# Patient Record
Sex: Male | Born: 1980 | Race: Black or African American | Hispanic: No | Marital: Single
Health system: Southern US, Community
[De-identification: ages and names within clinical notes are randomized; demographics above are authoritative.]

---

## 2004-04-19 ENCOUNTER — Inpatient Hospital Stay (HOSPITAL_COMMUNITY): Admission: EM | Admit: 2004-04-19 | Discharge: 2004-04-20 | Payer: Self-pay

## 2004-04-25 ENCOUNTER — Emergency Department (HOSPITAL_COMMUNITY): Admission: EM | Admit: 2004-04-25 | Discharge: 2004-04-25 | Payer: Self-pay | Admitting: Emergency Medicine

## 2005-06-03 ENCOUNTER — Emergency Department (HOSPITAL_COMMUNITY): Admission: EM | Admit: 2005-06-03 | Discharge: 2005-06-03 | Payer: Self-pay | Admitting: Emergency Medicine

## 2010-08-27 ENCOUNTER — Emergency Department (HOSPITAL_COMMUNITY): Admission: EM | Admit: 2010-08-27 | Discharge: 2010-08-27 | Payer: Self-pay | Admitting: Emergency Medicine
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
## 2011-07-05 ENCOUNTER — Ambulatory Visit
Admission: RE | Admit: 2011-07-05 | Discharge: 2011-07-05 | Disposition: A | Discharge status: Home / Self Care | Payer: Self-pay | Source: Ambulatory Visit | Attending: Family Medicine | Admitting: Family Medicine

## 2011-07-05 ENCOUNTER — Other Ambulatory Visit: Payer: Self-pay | Admitting: Family Medicine

## 2011-07-05 DIAGNOSIS — R609 Edema, unspecified: Secondary | ICD-10-CM

## 2011-07-05 DIAGNOSIS — T1490XA Injury, unspecified, initial encounter: Secondary | ICD-10-CM
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
## 2020-10-25 ENCOUNTER — Encounter (HOSPITAL_COMMUNITY): Payer: Self-pay | Admitting: Emergency Medicine

## 2020-10-25 ENCOUNTER — Emergency Department (HOSPITAL_COMMUNITY)
Admission: EM | Admit: 2020-10-25 | Discharge: 2020-10-25 | Disposition: A | Discharge status: Home / Self Care | Payer: PRIVATE HEALTH INSURANCE | Attending: Emergency Medicine | Admitting: Emergency Medicine

## 2020-10-25 ENCOUNTER — Emergency Department (HOSPITAL_COMMUNITY): Payer: PRIVATE HEALTH INSURANCE

## 2020-10-25 DIAGNOSIS — M79602 Pain in left arm: Secondary | ICD-10-CM | POA: Insufficient documentation

## 2020-10-25 DIAGNOSIS — R0789 Other chest pain: Secondary | ICD-10-CM | POA: Insufficient documentation

## 2020-10-25 DIAGNOSIS — R079 Chest pain, unspecified: Secondary | ICD-10-CM

## 2020-10-25 LAB — CBC
HCT: 44.9 % (ref 39.0–52.0)
Hemoglobin: 14.9 g/dL (ref 13.0–17.0)
MCH: 32.3 pg (ref 26.0–34.0)
MCHC: 33.2 g/dL (ref 30.0–36.0)
MCV: 97.4 fL (ref 80.0–100.0)
Platelets: 288 10*3/uL (ref 150–400)
RBC: 4.61 MIL/uL (ref 4.22–5.81)
RDW: 12.4 % (ref 11.5–15.5)
WBC: 6.2 10*3/uL (ref 4.0–10.5)
nRBC: 0 % (ref 0.0–0.2)

## 2020-10-25 LAB — BASIC METABOLIC PANEL
Anion gap: 11 (ref 5–15)
BUN: 11 mg/dL (ref 6–20)
CO2: 23 mmol/L (ref 22–32)
Calcium: 8.9 mg/dL (ref 8.9–10.3)
Chloride: 104 mmol/L (ref 98–111)
Creatinine, Ser: 1.07 mg/dL (ref 0.61–1.24)
GFR, Estimated: >60 mL/min (ref >60)
Glucose, Bld: 80 mg/dL (ref 70–99)
Potassium: 4.2 mmol/L (ref 3.5–5.1)
Sodium: 138 mmol/L (ref 135–145)

## 2020-10-25 LAB — TROPONIN I (HIGH SENSITIVITY)
Troponin I (High Sensitivity): <2 ng/L (ref <18)
Troponin I (High Sensitivity): <2 ng/L (ref <18)

## 2020-10-25 NOTE — ED Triage Notes (Signed)
Pt reports chest pain, onset this am around 8am while at work, does endorse some physical labor on the job. States chest pain has occurred off and on for a while but worse today. A/ox4, resp e/u, nad.

## 2020-10-25 NOTE — ED Provider Notes (Signed)
[facility] EMERGENCY DEPARTMENT Provider Note   CSN: [id number] Arrival date & time: 10/25/20  1138     History Chief Complaint  Patient presents with  . Chest Pain    Gregory Ballard is a 39 y.o. male.   Chest Pain Pain location:  Substernal area Pain quality comment:  Unable to specify Pain radiates to:  L arm Pain severity:  Moderate Onset quality:  Gradual Timing:  Constant Progression:  Worsening Chronicity:  Recurrent Context comment:  While working Relieved by:  Nothing Worsened by:  Nothing Ineffective treatments:  None tried Associated symptoms: no anorexia, no back pain, no cough, no diaphoresis, no fever, no headache, no nausea, no palpitations, no shortness of breath and no vomiting     HPI: A 39 year old patient with a history of hypertension presents for evaluation of chest pain. Initial onset of pain was approximately 3-6 hours ago. The patient's chest pain is not worse with exertion. The patient's chest pain is middle- or left-sided, is not well-localized, is not described as heaviness/pressure/tightness, is not sharp and does radiate to the arms/jaw/neck. The patient does not complain of nausea and denies diaphoresis. The patient has smoked in the past 90 days and has a family history of coronary artery disease in a first-degree relative with onset less than age 32. The patient has no history of stroke, has no history of peripheral artery disease, denies any history of treated diabetes, has no history of hypercholesterolemia and does not have an elevated BMI (>=30).   History reviewed. No pertinent past medical history.  There are no problems to display for this patient.   History reviewed. No pertinent surgical history.     No family history on file.  Social History   Tobacco Use  . Smoking status: Not on file  Substance Use Topics  . Alcohol use: Not on file  . Drug use: Not on file    Home Medications Prior to Admission  medications   Medication Sig Start Date End Date Taking? Authorizing Provider  cyclobenzaprine (FLEXERIL) 10 MG tablet Take 10 mg by mouth 3 (three) times daily. 08/18/20  Yes [provider]  nitroGLYCERIN (NITROSTAT) 0.4 MG SL tablet Place 0.4 mg under the tongue every 5 (five) minutes as needed for chest pain.  01/28/20  Yes [provider]    Allergies    Patient has no known allergies.  Review of Systems   Review of Systems  Constitutional: Negative for chills, diaphoresis and fever.  HENT: Negative for congestion and rhinorrhea.   Respiratory: Negative for cough and shortness of breath.   Cardiovascular: Positive for chest pain. Negative for palpitations.  Gastrointestinal: Negative for anorexia, diarrhea, nausea and vomiting.  Genitourinary: Negative for difficulty urinating and dysuria.  Musculoskeletal: Negative for arthralgias and back pain.  Skin: Negative for color change and rash.  Neurological: Negative for light-headedness and headaches.    Physical Exam Updated Vital Signs BP (!) 147/87 (BP Location: Left Arm)   Pulse 60   Temp 98.2 F (36.8 C) (Oral)   Resp 16   SpO2 99%   Physical Exam Vitals and nursing note reviewed.  Constitutional:      General: He is not in acute distress.    Appearance: Normal appearance.  HENT:     Head: Normocephalic and atraumatic.     Nose: No rhinorrhea.  Eyes:     General:        Right eye: No discharge.  Left eye: No discharge.     Conjunctiva/sclera: Conjunctivae normal.  Cardiovascular:     Rate and Rhythm: Normal rate and regular rhythm.  Pulmonary:     Effort: Pulmonary effort is normal.     Breath sounds: No stridor. No decreased breath sounds, wheezing, rhonchi or rales.  Abdominal:     General: Abdomen is flat. There is no distension.     Palpations: Abdomen is soft.  Musculoskeletal:        General: No deformity or signs of injury.  Skin:    General: Skin is warm and dry.    Neurological:     General: No focal deficit present.     Mental Status: He is alert. Mental status is at baseline.     Motor: No weakness.  Psychiatric:        Mood and Affect: Mood normal.        Behavior: Behavior normal.        Thought Content: Thought content normal.     ED Results / Procedures / Treatments   Labs (all labs ordered are listed, but only abnormal results are displayed) Labs Reviewed  BASIC METABOLIC PANEL  CBC  TROPONIN I (HIGH SENSITIVITY)  TROPONIN I (HIGH SENSITIVITY)    EKG EKG Interpretation  Date/Time:  Tuesday October 25 2020 11:55:58 EST Ventricular Rate:  73 PR Interval:  160 QRS Duration: 86 QT Interval:  374 QTC Calculation: 412 R Axis:   105 Text Interpretation: Normal sinus rhythm Rightward axis Borderline ECG Confirmed by Cherlynn Perches ([id number]) on 10/25/2020 2:56:55 PM   Radiology DG Chest 2 View  Result Date: 10/25/2020 CLINICAL DATA:  Chest pain. EXAM: CHEST - 2 VIEW COMPARISON:  04/19/2004. FINDINGS: Mediastinum and hilar structures normal. Lungs are clear. No pleural effusion or pneumothorax. Heart size normal. IMPRESSION: No acute cardiopulmonary disease. Electronically Signed   By: Maisie Fus  Register   On: 10/25/2020 12:33    Procedures Procedures (including critical care time)  Medications Ordered in ED Medications - No data to display  ED Course  I have reviewed the triage vital signs and the nursing notes.  Pertinent labs & imaging results that were available during my care of the patient were reviewed by me and considered in my medical decision making (see chart for details).    MDM Rules/Calculators/A&P HEAR Score: 16                        39 year old male comes in with sudden onset chest pain at rest, minimal exertion at his work, mild radiation to the left upper extremity but no other symptoms.  Pain resolving.  Has had this in the past that worked up in the past, has been told there is not his heart.  He is here today  with normal EKG without acute ischemic change interval abnormality or arrhythmia.  Chest x-ray reviewed by radiology myself shows no acute cardiopulmonary pathology.  Labs reviewed by myself show no elevation in troponin or other significant derangements.  He will get a second troponin.  Hear score 3.  PERC negative.  Second troponin is also negative after my review.  Patient is resting comfortably no pain.  Is safe for discharge home.  Strict return precautions are given.  Vital signs stable time of discharge.  Final Clinical Impression(s) / ED Diagnoses Final diagnoses:  Chest pain, unspecified type    Rx / DC Orders ED Discharge Orders    None  Sabino Donovan, MD 10/25/20 [phone]

## 2022-03-03 IMAGING — DX DG CHEST 2V
2 series · 2 of 2 positions shown · non-contrast
Comparison: 04/19/2004.

CLINICAL DATA: Chest pain.

EXAM:
CHEST - 2 VIEW

[w chest pa]
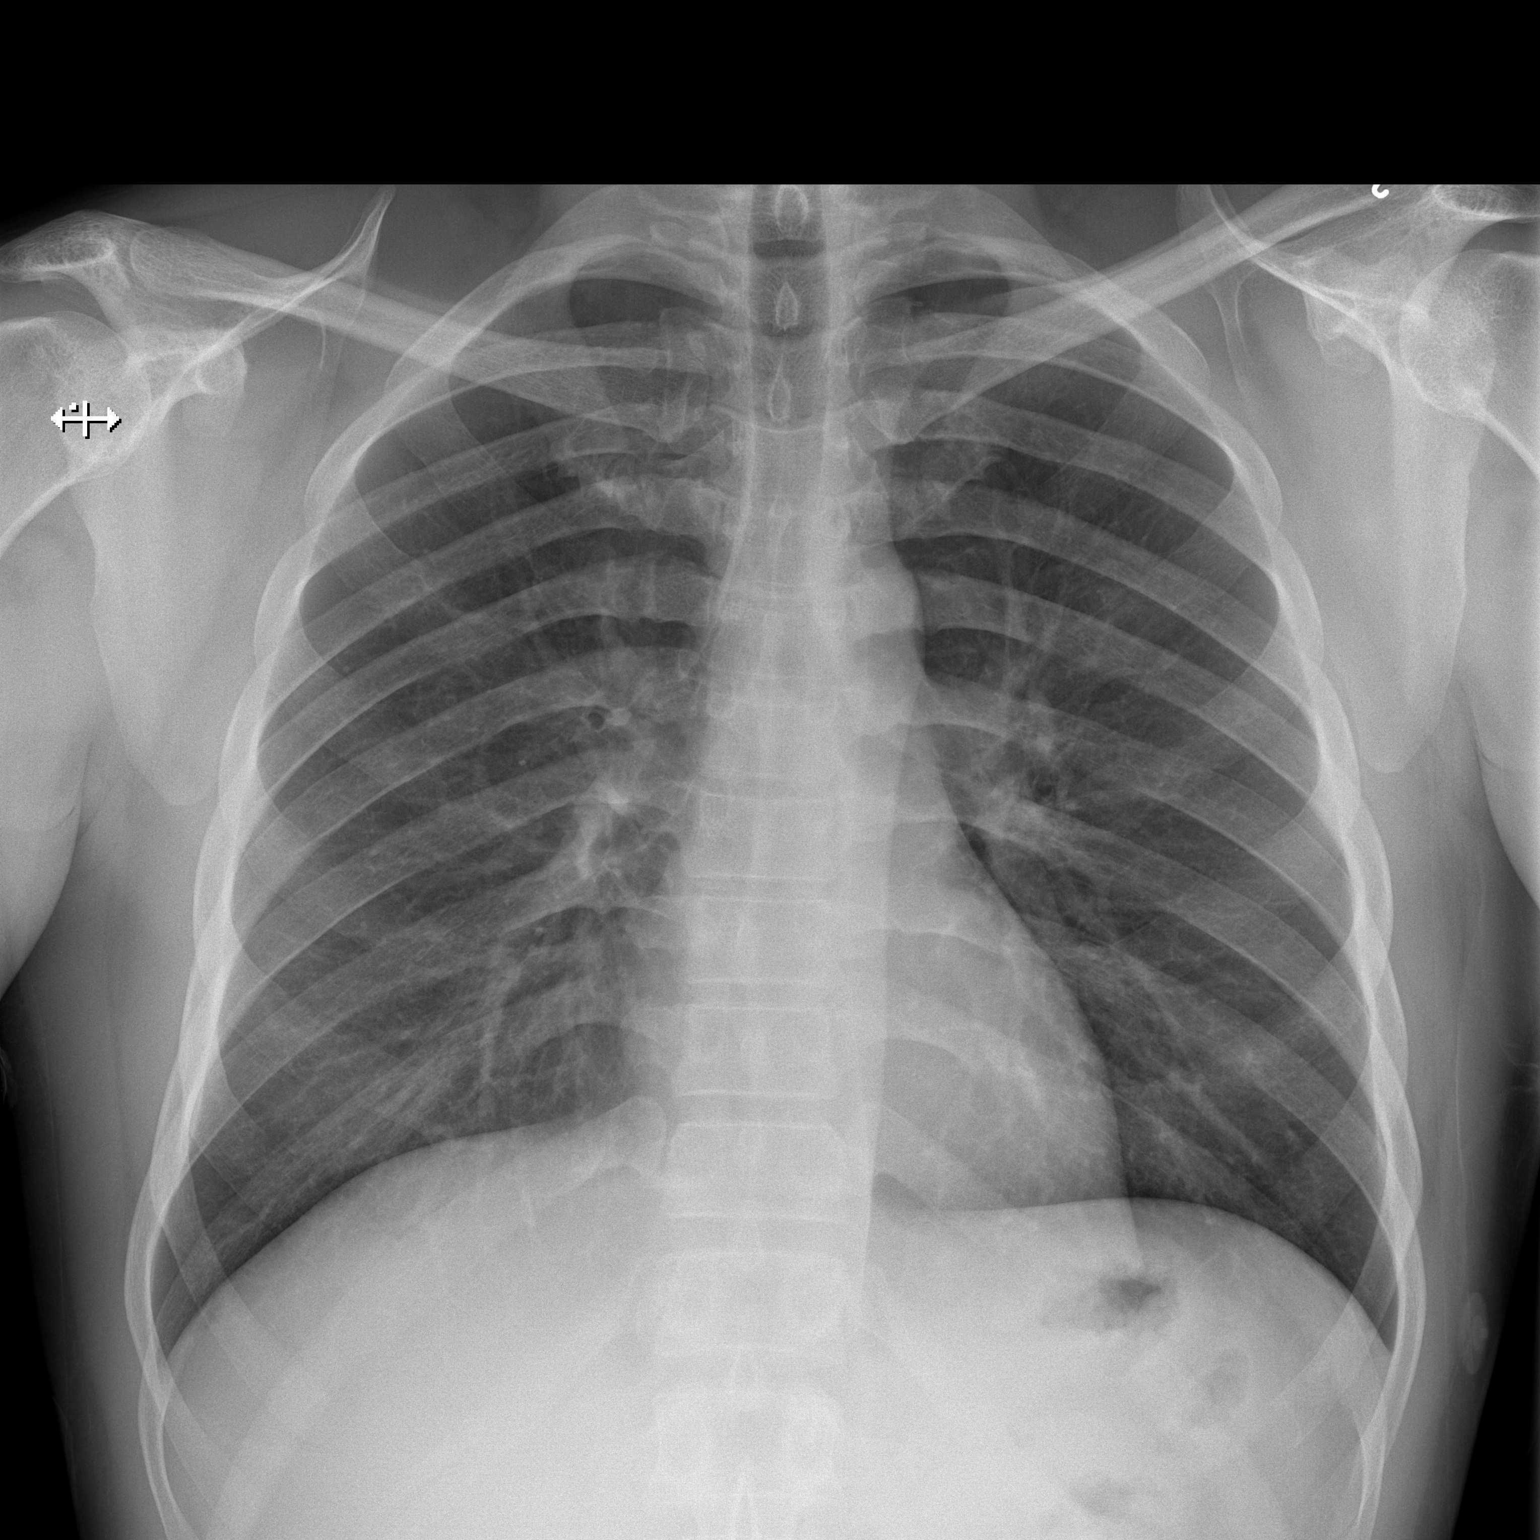

[w chest lat]
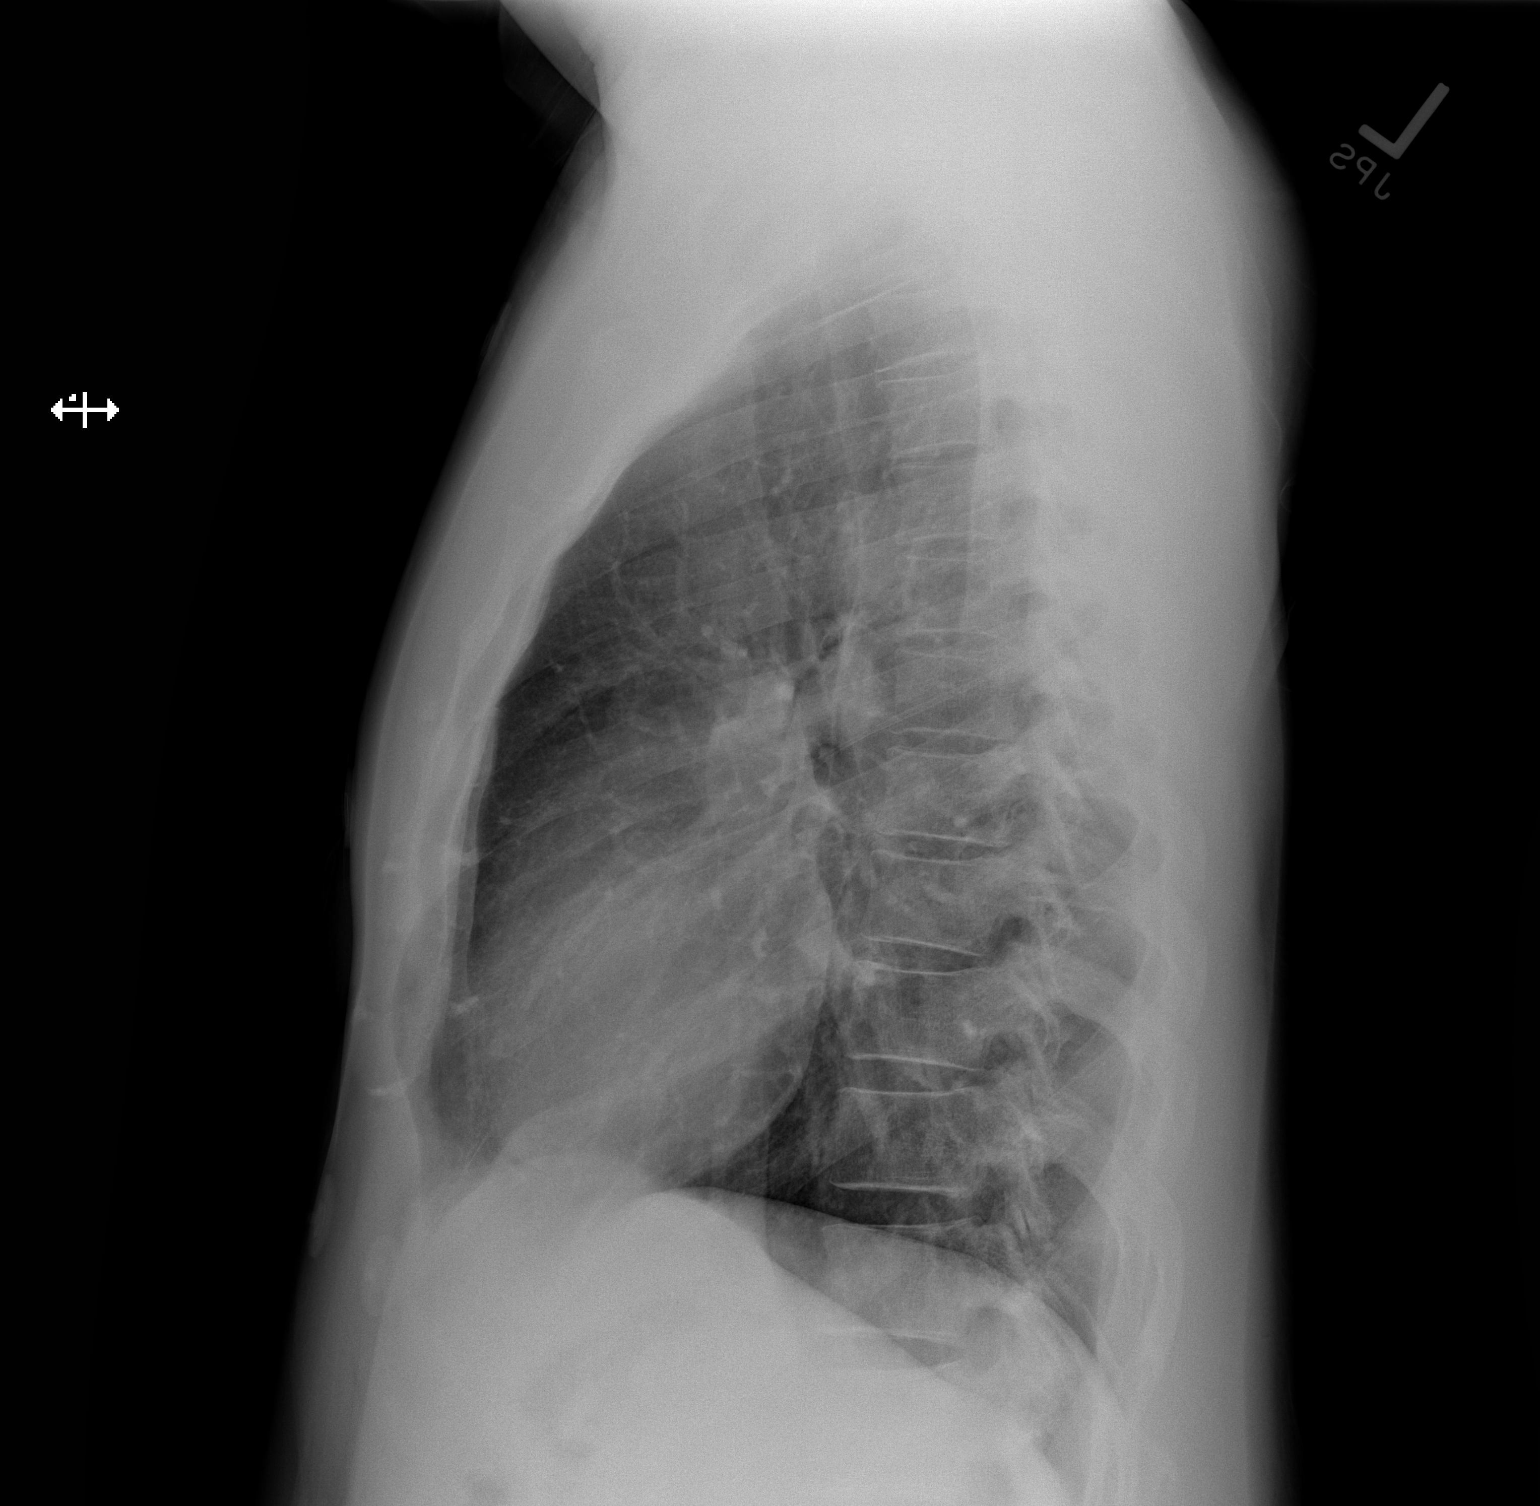

[2 of 2 positions shown; findings below may reference images not displayed]

FINDINGS: Mediastinum and hilar structures normal. Lungs are clear. No pleural
effusion or pneumothorax. Heart size normal.
IMPRESSION: No acute cardiopulmonary disease.
